# Patient Record
Sex: Male | Born: 1960 | Race: White | Hispanic: No | Marital: Married | Smoking: Never smoker
Health system: Southern US, Community
[De-identification: ages and names within clinical notes are randomized; demographics above are authoritative.]

## PROBLEM LIST (undated history)

## (undated) HISTORY — PX: CHOLECYSTECTOMY: SHX55

---

## 2014-02-06 ENCOUNTER — Encounter (HOSPITAL_BASED_OUTPATIENT_CLINIC_OR_DEPARTMENT_OTHER): Payer: Self-pay | Admitting: *Deleted

## 2014-02-06 ENCOUNTER — Emergency Department (HOSPITAL_BASED_OUTPATIENT_CLINIC_OR_DEPARTMENT_OTHER): Payer: BLUE CROSS/BLUE SHIELD

## 2014-02-06 ENCOUNTER — Emergency Department (HOSPITAL_BASED_OUTPATIENT_CLINIC_OR_DEPARTMENT_OTHER)
Admission: EM | Admit: 2014-02-06 | Discharge: 2014-02-06 | Disposition: A | Discharge status: Home / Self Care | Payer: BLUE CROSS/BLUE SHIELD | Attending: Emergency Medicine | Admitting: Emergency Medicine

## 2014-02-06 DIAGNOSIS — Y9323 Activity, snow (alpine) (downhill) skiing, snow boarding, sledding, tobogganing and snow tubing: Secondary | ICD-10-CM | POA: Insufficient documentation

## 2014-02-06 DIAGNOSIS — Y9289 Other specified places as the place of occurrence of the external cause: Secondary | ICD-10-CM | POA: Diagnosis not present

## 2014-02-06 DIAGNOSIS — Y998 Other external cause status: Secondary | ICD-10-CM | POA: Diagnosis not present

## 2014-02-06 DIAGNOSIS — S63287A Dislocation of proximal interphalangeal joint of left little finger, initial encounter: Secondary | ICD-10-CM | POA: Insufficient documentation

## 2014-02-06 DIAGNOSIS — S63259A Unspecified dislocation of unspecified finger, initial encounter: Secondary | ICD-10-CM

## 2014-02-06 DIAGNOSIS — T1490XA Injury, unspecified, initial encounter: Secondary | ICD-10-CM

## 2014-02-06 DIAGNOSIS — S6990XA Unspecified injury of unspecified wrist, hand and finger(s), initial encounter: Secondary | ICD-10-CM

## 2014-02-06 DIAGNOSIS — S6992XA Unspecified injury of left wrist, hand and finger(s), initial encounter: Secondary | ICD-10-CM | POA: Diagnosis present

## 2014-02-06 MED ORDER — HYDROCODONE-ACETAMINOPHEN 5-325 MG PO TABS: 1.0000 | ORAL_TABLET | Freq: Four times a day (QID) | ORAL | Status: AC | PRN

## 2014-02-06 MED ORDER — LIDOCAINE HCL (PF) 2 % IJ SOLN
INTRAMUSCULAR | Status: AC
  Administered 2014-02-06: 18:00:00
  Filled 2014-02-06: qty 2

## 2014-02-06 NOTE — Discharge Instructions (Signed)
Wear finger splint as applied.  Ice for 20 minutes every 2 hours while awake for the next 2 days.  Follow-up with hand surgery if not improving in the next week.  Hydrocodone as prescribed as needed for pain.   Finger Dislocation Finger dislocation is the displacement of bones in your finger at the joints. Most commonly, finger dislocation occurs at the proximal interphalangeal joint (the joint closest to your knuckle). Very strong, fibrous tissues (ligaments) and joint capsules connect the three bones of your fingers.  CAUSES Dislocation is caused by a forceful impact. This impact moves these bones off the joint and often tears your ligaments.  SYMPTOMS Symptoms of finger dislocation include:  Deformity of your finger.  Pain, with loss of movement. DIAGNOSIS  Finger dislocation is diagnosed with a physical exam. Often, X-ray exams are done to see if you have associated injuries, such as bone fractures. TREATMENT  Finger dislocations are treated by putting your bones back into position (reduction) either by manually moving the bones back into place or through surgery. Your finger is then kept in a fixed position (immobilized) with the use of a dressing or splint for a brief period. When your ligament has to be surgically repaired, it needs to be kept in a fixed position with a dressing or splint for 1 to 2 weeks. Because joint stiffness is a long-term complication of finger dislocation, hand exercises or physical therapy to increase the range of motion and to regain strength is usually started as soon as the ligament is healed. Exercises and therapy generally last no more than 3 months. HOME CARE INSTRUCTIONS The following measures can help to reduce pain and speed up the healing process:  Rest your injured joint. Do not move until instructed otherwise by your caregiver. Avoid activities similar to the one that caused your injury.  Apply ice to your injured joint for the first day or 2  after your reduction or as directed by your caregiver. Applying ice helps to reduce inflammation and pain.  Put ice in a plastic bag.  Place a towel between your skin and the bag.  Leave the ice on for 15-20 minutes at a time, every 2 hours while you are awake.  Elevate your hand above your heart as directed by your caregiver to reduce swelling.  Take over-the-counter or prescription medicine for pain as your caregiver instructs you. SEEK IMMEDIATE MEDICAL CARE IF:  Your dressing or splint becomes damaged.  Your pain becomes worse rather than better.  You lose feeling in your finger, or it becomes cold and white. MAKE SURE YOU:  Understand these instructions.  Will watch your condition.  Will get help right away if you are not doing well or get worse. Document Released: 12/30/1999 Document Revised: 03/26/2011 Document Reviewed: 10/22/2010 [facility] Patient Information 2015 [employer] [place]. This information is not intended to replace advice given to you by your health care provider. Make sure you discuss any questions you have with your health care provider.

## 2014-02-06 NOTE — ED Notes (Signed)
Pt jammed his left pinky finger while sledding.  His left pinky wont move, cap refill <3sec.

## 2014-02-06 NOTE — ED Provider Notes (Signed)
CSN: [id number]     Arrival date & time 02/06/14  1709 History   First MD Initiated Contact with Patient 02/06/14 1719     Chief Complaint  Patient presents with  . Finger Injury     (Consider location/radiation/quality/duration/timing/severity/associated sxs/prior Treatment) HPI Comments: Patient is a 54 year old male with no significant past medical history. He presents with complaints of left fifth finger pain. He states that he was going sledding when he slipped and fell and injured his finger. He is having increased discomfort when he attempts to move it. Feels somewhat better with rest.  The history is provided by the patient.    History reviewed. No pertinent past medical history. Past Surgical History  Procedure Laterality Date  . Cholecystectomy     No family history on file. History  Substance Use Topics  . Smoking status: Never Smoker   . Smokeless tobacco: Not on file  . Alcohol Use: Yes    Review of Systems  All other systems reviewed and are negative.     Allergies  Review of patient's allergies indicates no known allergies.  Home Medications   Prior to Admission medications   Not on File   BP 147/93 mmHg  Pulse 90  Temp(Src) 98.5 F (36.9 C) (Oral)  Resp 18  Ht 6' (1.829 m)  Wt 220 lb (99.791 kg)  BMI 29.83 kg/m2  SpO2 97% Physical Exam  Constitutional: He is oriented to person, place, and time. He appears well-developed and well-nourished. No distress.  HENT:  Head: Normocephalic and atraumatic.  Neck: Normal range of motion. Neck supple.  Musculoskeletal:  The left fifth finger has deformity at the PIP joint. There is pain with range of motion.  Neurological: He is alert and oriented to person, place, and time.  Skin: Skin is warm and dry. He is not diaphoretic.  Nursing note and vitals reviewed.   ED Course  Procedures (including critical care time) Labs Review Labs Reviewed - No data to display  Imaging Review No results  found.   EKG Interpretation None      MDM   Final diagnoses:  Injury    X-rays reveal a dislocation of the PIP joint. Initial attempts at reduction were unsuccessful. Due to pain, a digital block was administered, then the joint was successfully reduced. X-rays also reveal what may be an avulsion fracture of the proximal phalangeal head. He will be splinted and advised to follow-up with hand surgery as needed.    Geoffery Lyonsouglas Quinisha Mould, MD 02/06/14 [phone]

## 2015-06-09 IMAGING — CR DG FINGER LITTLE 2+V*L*
3 series · 3 of 3 positions shown · non-contrast
Comparison: Earlier the same date.

CLINICAL DATA: Postreduction left fifth PIP dislocation.

EXAM:
LEFT LITTLE FINGER 2+V

[x finger pa left]
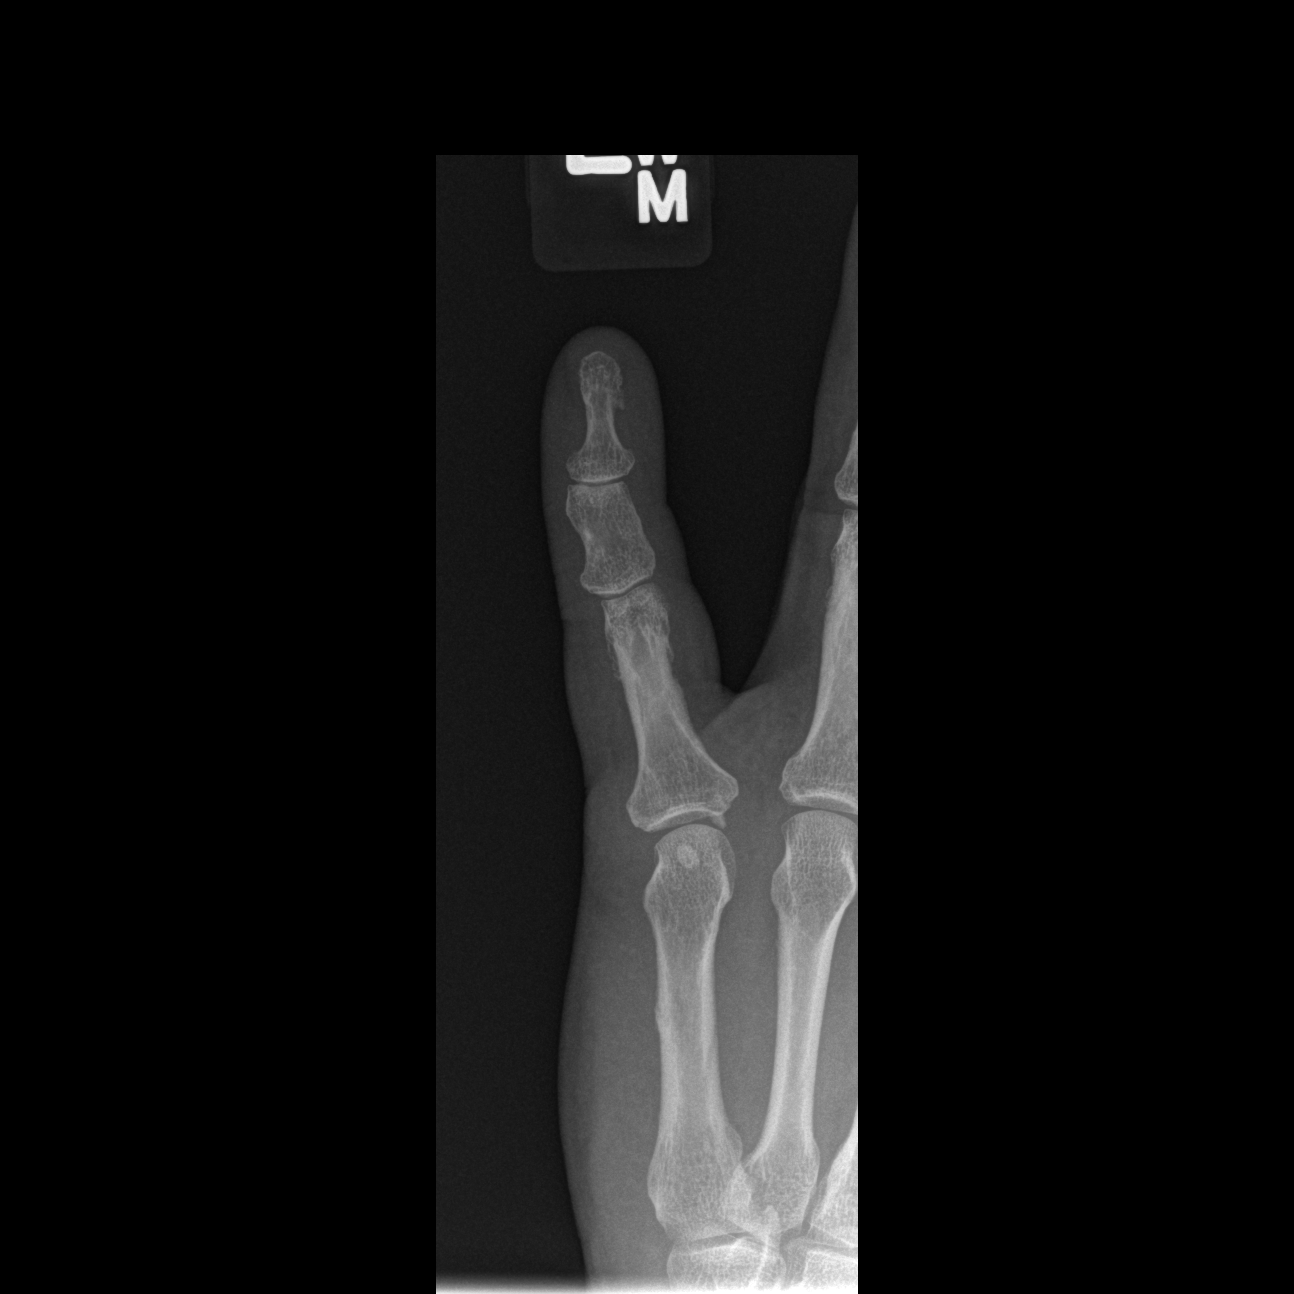

[x finger obl. left]
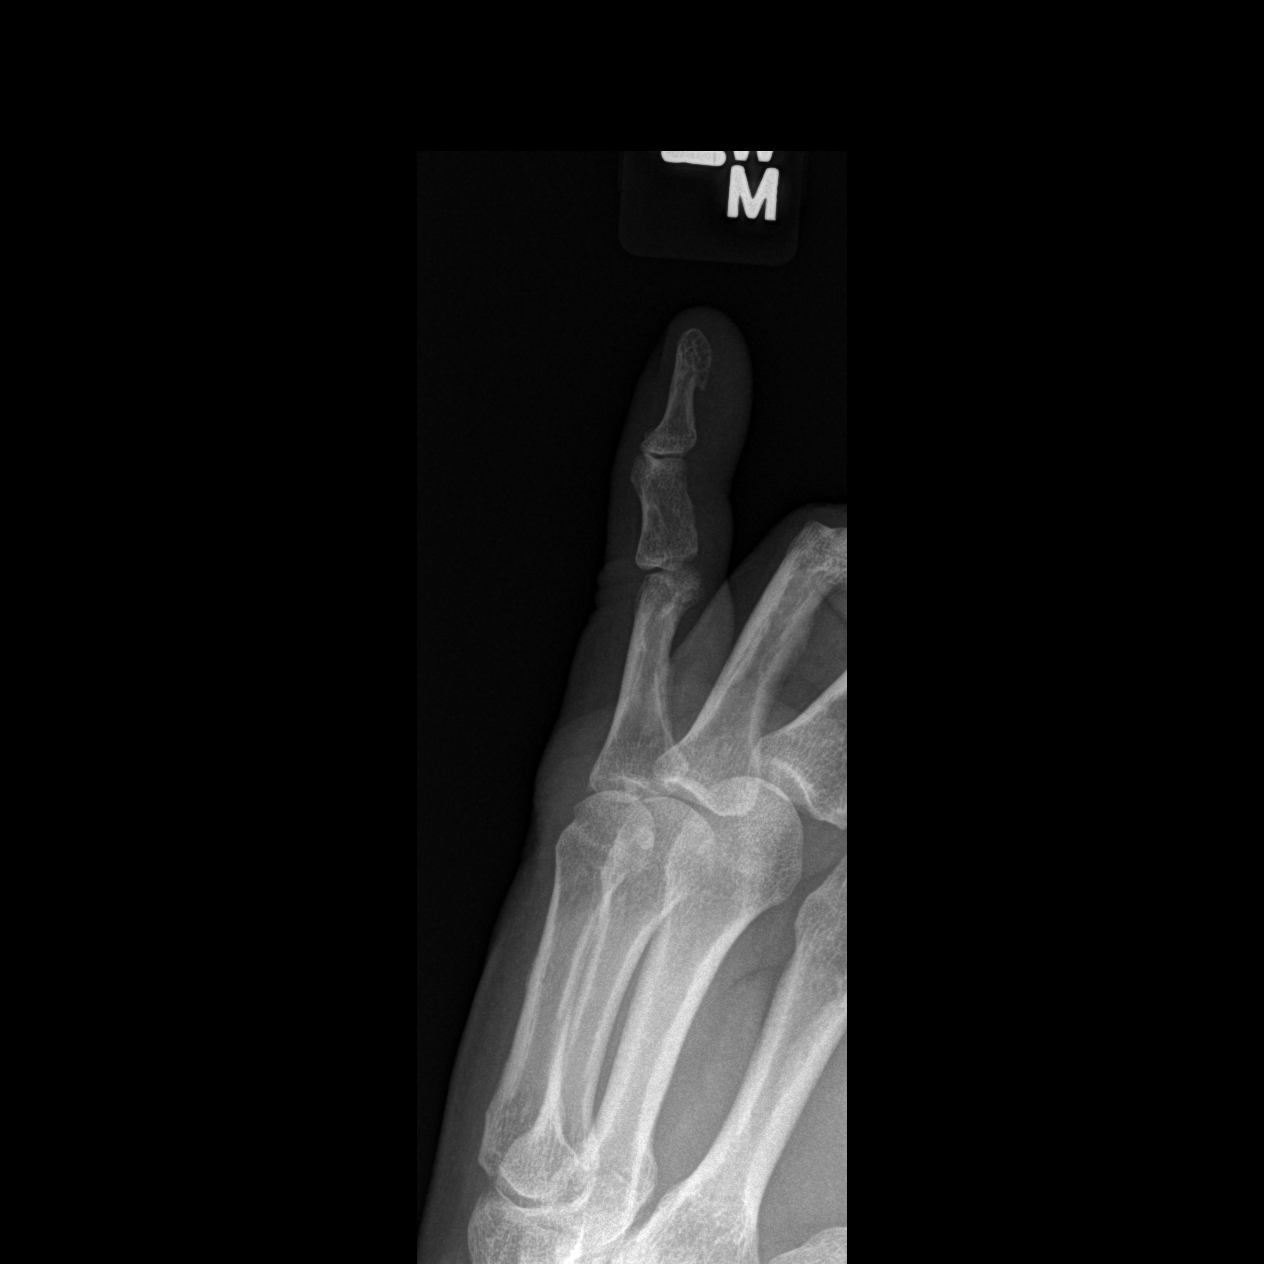

[x finger lateral left]
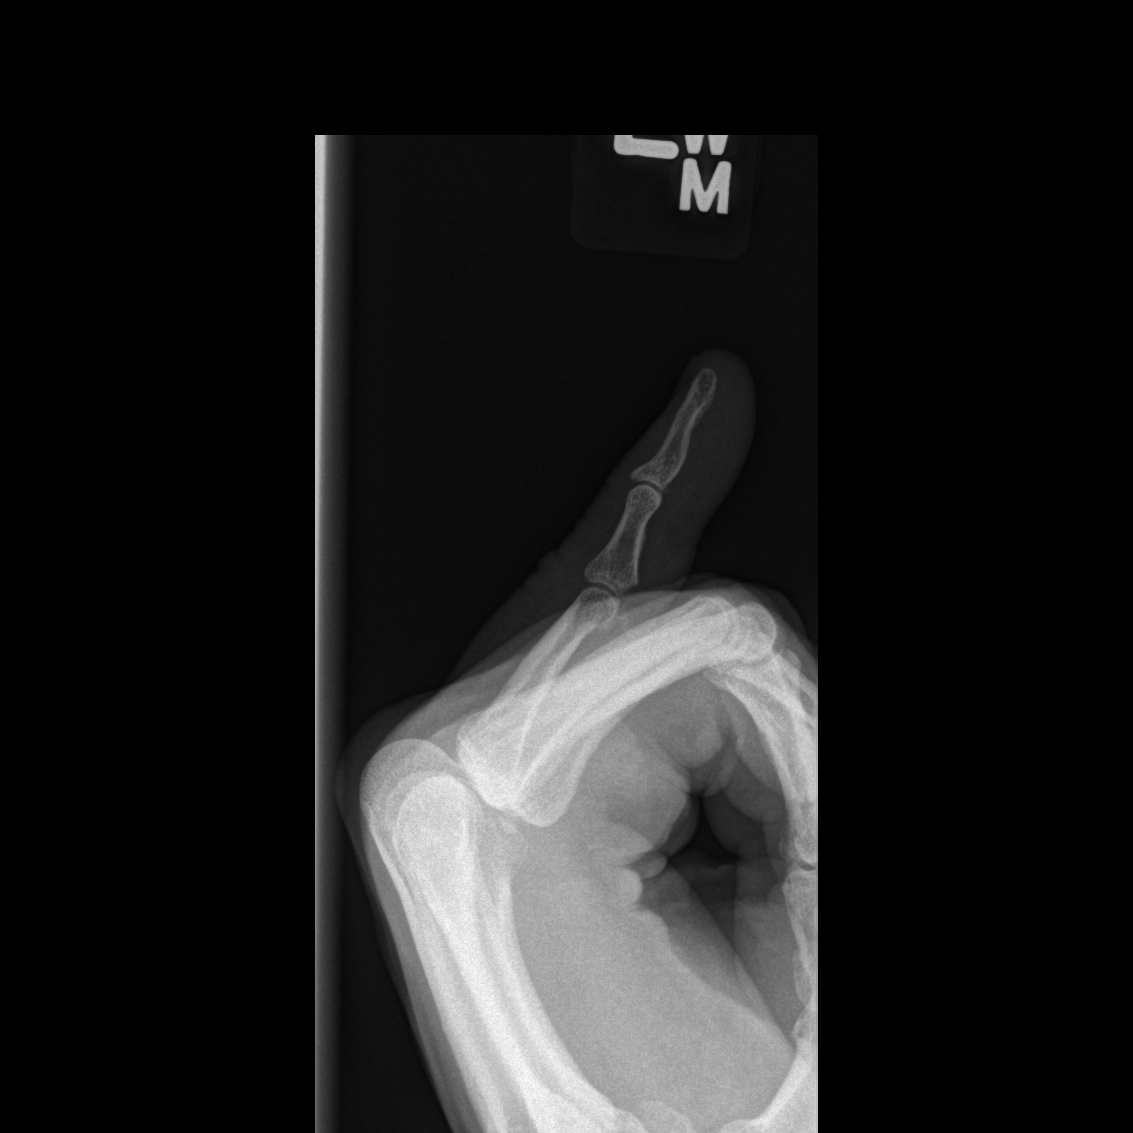

[3 of 3 positions shown; findings below may reference images not displayed]

FINDINGS: 4519 hr. The dislocated proximal interphalangeal joint has been
reduced. Based on the prior study, there is a possible avulsion
fracture fragment anterior to the proximal phalangeal head, although
this is not well seen on these views. No new findings demonstrated.
IMPRESSION: Successful reduction of dislocated left fifth proximal
interphalangeal joint with probable avulsion fracture as described.

## 2022-08-07 ENCOUNTER — Other Ambulatory Visit: Payer: Self-pay

## 2022-08-07 ENCOUNTER — Encounter (HOSPITAL_BASED_OUTPATIENT_CLINIC_OR_DEPARTMENT_OTHER): Payer: Self-pay | Admitting: Emergency Medicine

## 2022-08-07 ENCOUNTER — Emergency Department (HOSPITAL_BASED_OUTPATIENT_CLINIC_OR_DEPARTMENT_OTHER)
Admission: EM | Admit: 2022-08-07 | Discharge: 2022-08-07 | Disposition: A | Discharge status: Home / Self Care | Payer: BC Managed Care – PPO | Attending: Emergency Medicine | Admitting: Emergency Medicine

## 2022-08-07 DIAGNOSIS — W290XXA Contact with powered kitchen appliance, initial encounter: Secondary | ICD-10-CM | POA: Diagnosis not present

## 2022-08-07 DIAGNOSIS — S61211A Laceration without foreign body of left index finger without damage to nail, initial encounter: Secondary | ICD-10-CM | POA: Insufficient documentation

## 2022-08-07 MED ORDER — LIDOCAINE HCL (PF) 1 % IJ SOLN
10.0000 mL | Freq: Once | INTRAMUSCULAR | Status: AC
  Administered 2022-08-07: 10 mL
  Filled 2022-08-07: qty 10

## 2022-08-07 NOTE — ED Provider Notes (Signed)
[facility] Provider Note   CSN: [id number] Arrival date & time: 08/07/22  1951     History  Chief Complaint  Patient presents with   Finger Laceration    Marc Richardson is a 62 y.o. male otherwise healthy presents today for evaluation of a finger laceration.  Patient accidentally cut his pointer finger on the left hand while using an immersion blender.  Bleeding controlled in triage.  Last tetanus shot was in 2023.  Denies any loss of sensation to his hand.  HPI   History reviewed. No pertinent past medical history. Past Surgical History:  Procedure Laterality Date   CHOLECYSTECTOMY       Home Medications Prior to Admission medications   Medication Sig Start Date End Date Taking? Authorizing Provider  HYDROcodone-acetaminophen (NORCO) 5-325 MG per tablet Take 1-2 tablets by mouth every 6 (six) hours as needed. 02/06/14   Geoffery Lyons, MD      Allergies    Celebrex [celecoxib]    Review of Systems   Review of Systems Negative except as per HPI.  Physical Exam Updated Vital Signs BP (!) 152/89   Pulse 76   Temp 98.3 F (36.8 C) (Oral)   Resp 18   Ht 5\' 11"  (1.803 m)   Wt 83.9 kg   SpO2 99%   BMI 25.80 kg/m  Physical Exam Vitals and nursing note reviewed.  Constitutional:      Appearance: Normal appearance.  HENT:     Head: Normocephalic and atraumatic.     Mouth/Throat:     Mouth: Mucous membranes are moist.  Eyes:     General: No scleral icterus. Cardiovascular:     Rate and Rhythm: Normal rate and regular rhythm.     Pulses: Normal pulses.     Heart sounds: Normal heart sounds.  Pulmonary:     Effort: Pulmonary effort is normal.     Breath sounds: Normal breath sounds.  Abdominal:     General: Abdomen is flat.     Palpations: Abdomen is soft.     Tenderness: There is no abdominal tenderness.  Musculoskeletal:        General: No deformity.  Skin:    General: Skin is warm.     Findings: No rash.      Comments: 1 cm laceration to the left second finger.  Bleeding is controlled.  Neurovascular function of the left hand intact.  Neurological:     General: No focal deficit present.     Mental Status: He is alert.  Psychiatric:        Mood and Affect: Mood normal.     ED Results / Procedures / Treatments   Labs (all labs ordered are listed, but only abnormal results are displayed) Labs Reviewed - No data to display  EKG None  Radiology No results found.  Procedures .Marland KitchenLaceration Repair  Date/Time: 08/07/2022 9:53 PM  Performed by: Jeanelle Malling, PA Authorized by: Jeanelle Malling, PA   Consent:    Consent obtained:  Verbal   Consent given by:  Patient   Risks, benefits, and alternatives were discussed: yes     Risks discussed:  Infection and pain Universal protocol:    Procedure explained and questions answered to patient or proxy's satisfaction: yes     Relevant documents present and verified: yes     Test results available: yes     Patient identity confirmed:  Verbally with patient and arm band Anesthesia:    Anesthesia  method:  Local infiltration   Local anesthetic:  Lidocaine 1% w/o epi Laceration details:    Location: L index finger.   Length (cm):  1.5   Depth (mm):  2 Exploration:    Limited defect created (wound extended): no     Hemostasis achieved with:  Direct pressure   Imaging outcome: foreign body not noted     Contaminated: no   Treatment:    Area cleansed with:  Povidone-iodine and saline   Amount of cleaning:  Standard   Irrigation solution:  Sterile saline   Irrigation volume:  200   Irrigation method:  Pressure wash   Visualized foreign bodies/material removed: no     Debridement:  Minimal   Undermining:  None Skin repair:    Repair method:  Sutures   Suture size:  5-0   Suture material:  Nylon   Suture technique:  Simple interrupted   Number of sutures:  3 Approximation:    Approximation:  Close Repair type:    Repair type:  Simple Post-procedure  details:    Dressing:  Antibiotic ointment and bulky dressing   Procedure completion:  Tolerated well, no immediate complications     Medications Ordered in ED Medications - No data to display  ED Course/ Medical Decision Making/ A&P                             Medical Decision Making Risk Prescription drug management.   This patient presents to the ED for finger laceration, this involves an extensive number of treatment options, and is a complaint that carries with a high risk of complications and morbidity.  The differential diagnosis includes laceration, fracture, dislocation, retained foreign body.  This is not an exhaustive list.  Problem list/ ED course/ Critical interventions/ Medical management: HPI: See above Vital signs within normal range and stable throughout visit. Laboratory/imaging studies significant for: See above. On physical examination, patient is afebrile and appears in no acute distress. Wound inspected under direct bright light with good visualization. Area with linear laceration across soft tissue through adipose without exposure of muscle belly or tendon. No overt foreign body. Area hemostatic. Neurovascular exam congruent with above. Area extensively irrigated with sterile normal saline under pressure. Laceration repaired in simple fashion as above (please see procedure note for further details). Patient tolerated procedure well and neurovascular exam intact and unchanged post repair with intact distal pulses and cap refill. Cautious return precautions discussed w/ full understanding. Wound care discussed. Prompt follow up with primary care physician discussed and return for suture removal in 7 days. I have reviewed the patient home medicines and have made adjustments as needed.  Cardiac monitoring/EKG: The patient was maintained on a cardiac monitor.  I personally reviewed and interpreted the cardiac monitor which showed an underlying rhythm of: sinus  rhythm.  Additional history obtained: External records from outside source obtained and reviewed including: Chart review including previous notes, labs, imaging.  Consultations obtained:  Disposition Continued outpatient therapy. Follow-up with PCP recommended for reevaluation of symptoms. Treatment plan discussed with patient.  Pt acknowledged understanding was agreeable to the plan. Worrisome signs and symptoms were discussed with patient, and patient acknowledged understanding to return to the ED if they noticed these signs and symptoms. Patient was stable upon discharge.   This chart was dictated using voice recognition software.  Despite best efforts to proofread,  errors can occur which can change the documentation meaning.  Final Clinical Impression(s) / ED Diagnoses Final diagnoses:  Laceration of left index finger without foreign body without damage to nail, initial encounter    Rx / DC Orders ED Discharge Orders     None         Jeanelle Malling, [place] 08/07/22 2156    Terrilee Files, MD 08/08/22 1057

## 2022-08-07 NOTE — ED Triage Notes (Addendum)
Patient accidentally cut pointer finger on the L hand just prior to arrival with an immersion blender. UTD on tetanus. Bandaid over lac bleeding stopped in triage. 1 inch lac noted to the distal portion of the finger. Rebandaged in triage.

## 2022-08-07 NOTE — Discharge Instructions (Addendum)
Please take tylenol/ibuprofen for pain. Please return to an urgent care in about 7 days for suture removal.  Please do not hesitate to return to emergency department if worrisome signs symptoms we discussed become apparent.
